# Patient Record
Sex: Female | Born: 1995 | Race: White | Hispanic: Yes | Marital: Single | Smoking: Never smoker
Health system: Southern US, Community
[De-identification: ages and names within clinical notes are randomized; demographics above are authoritative.]

---

## 2017-11-02 ENCOUNTER — Ambulatory Visit (INDEPENDENT_AMBULATORY_CARE_PROVIDER_SITE_OTHER): Payer: PRIVATE HEALTH INSURANCE

## 2017-11-02 ENCOUNTER — Other Ambulatory Visit: Payer: Self-pay

## 2017-11-02 ENCOUNTER — Ambulatory Visit (HOSPITAL_COMMUNITY)
Admission: EM | Admit: 2017-11-02 | Discharge: 2017-11-02 | Disposition: A | Discharge status: Home / Self Care | Payer: PRIVATE HEALTH INSURANCE | Attending: Family Medicine | Admitting: Family Medicine

## 2017-11-02 DIAGNOSIS — M7989 Other specified soft tissue disorders: Secondary | ICD-10-CM

## 2017-11-02 NOTE — ED Triage Notes (Signed)
Pt states she has left foot pain x 2 week injury.

## 2017-11-02 NOTE — ED Provider Notes (Signed)
[facility]    CSN: [id number] Arrival date & time: 11/02/17  1023     History   Chief Complaint Chief Complaint  Patient presents with  . Foot Pain    HPI Gabrielle Pacheco is a 22 y.o. female.   HPI   Patient presents today with a complaint of left foot pain.  She sustained an injury x2 weeks ago and is now experiencing some left foot swelling.Prior treatment includes nothing. No gait or ambulation problems.  OB History   None      Home Medications    Prior to Admission medications   Not on File    Family History No family history on file.  Social History Social History   Tobacco Use  . Smoking status: Not on file  Substance Use Topics  . Alcohol use: Not on file  . Drug use: Not on file     Allergies   Patient has no allergy information on record.   Review of Systems Review of Systems Pertinent negatives listed in HPI Physical Exam Triage Vital Signs ED Triage Vitals [11/02/17 1124]  Enc Vitals Group     BP 106/68     Pulse Rate 88     Resp (!) 65     Temp 98.1 F (36.7 C)     Temp Source Oral     SpO2 99 %     Weight      Height      Head Circumference      Peak Flow      Pain Score      Pain Loc      Pain Edu?      Excl. in GC?    No data found.  Updated Vital Signs BP 106/68 (BP Location: Left Arm)   Pulse 88   Temp 98.1 F (36.7 C) (Oral)   Resp (!) 65   SpO2 99%   Visual Acuity Right Eye Distance:   Left Eye Distance:   Bilateral Distance:    Right Eye Near:   Left Eye Near:    Bilateral Near:     Physical Exam General appearance: alert, well developed, well nourished, cooperative and in no distress Head: Normocephalic, without obvious abnormality, atraumatic Respiratory: Respirations even and unlabored, normal respiratory rate Extremities: No gross deformities Skin: Skin color, texture, turgor normal. No rashes seen  Psych: Appropriate mood and affect. Neurologic: Mental status: Alert, oriented to  person, place, and time, thought content appropriate. UC Treatments / Results  Labs (all labs ordered are listed, but only abnormal results are displayed) Labs Reviewed - No data to display  EKG None  Radiology No results found.  Procedures Procedures (including critical care time)  Medications Ordered in UC Medications - No data to display  Initial Impression / Assessment and Plan / UC Course  I have reviewed the triage vital signs and the nursing notes.  Pertinent labs & imaging results that were available during my care of the patient were reviewed by me and considered in my medical decision making (see chart for details).   Patient presents today with ankle swelling in the absence of an known injury. Imaging was negative for any acute fracture or abnormalities. Patient advised to continue to alternate ice and heat as well as elevate right foot to reduce swelling. Also recommend ibuprofen as needed if pain occurs.  Patient is to follow-up with primary care provider if swelling does not resolve.  Patient agrees and follows up with plan. Final  Clinical Impressions(s) / UC Diagnoses   Final diagnoses:  Swelling of left foot   Discharge Instructions   None    ED Prescriptions    None     Controlled Substance Prescriptions [place] Controlled Substance Registry consulted? Not Applicable   Bing Neighbors, FNP 11/03/17 2144

## 2019-07-28 ENCOUNTER — Ambulatory Visit (HOSPITAL_COMMUNITY)
Admission: RE | Admit: 2019-07-28 | Discharge: 2019-07-28 | Disposition: A | Discharge status: Home / Self Care | Payer: BC Managed Care – PPO | Source: Ambulatory Visit | Attending: Family Medicine | Admitting: Family Medicine

## 2019-07-28 ENCOUNTER — Other Ambulatory Visit: Payer: Self-pay

## 2019-07-28 ENCOUNTER — Encounter (HOSPITAL_COMMUNITY): Payer: Self-pay

## 2019-07-28 VITALS — BP 131/72 | HR 107 | Temp 98.0°F | Resp 17

## 2019-07-28 DIAGNOSIS — R0981 Nasal congestion: Secondary | ICD-10-CM | POA: Insufficient documentation

## 2019-07-28 DIAGNOSIS — J029 Acute pharyngitis, unspecified: Secondary | ICD-10-CM | POA: Insufficient documentation

## 2019-07-28 DIAGNOSIS — R5383 Other fatigue: Secondary | ICD-10-CM | POA: Diagnosis not present

## 2019-07-28 DIAGNOSIS — Z20822 Contact with and (suspected) exposure to covid-19: Secondary | ICD-10-CM | POA: Insufficient documentation

## 2019-07-28 DIAGNOSIS — R52 Pain, unspecified: Secondary | ICD-10-CM | POA: Diagnosis not present

## 2019-07-28 LAB — POCT RAPID STREP A: Streptococcus, Group A Screen (Direct): NEGATIVE

## 2019-07-28 NOTE — Discharge Instructions (Signed)
You may use over the counter ibuprofen or acetaminophen as needed.  For a sore throat, over the counter products such as [employer] Peroxyl Mouth Sore Rinse or Chloraseptic Sore Throat Spray may provide some temporary relief. Your rapid strep test was negative today. We have sent your throat swab for culture and will let you know of any positive results.  You have been tested for COVID-19 today. If your test returns positive, you will receive a phone call from [facility] regarding your results. Negative test results are not called. Both positive and negative results area always visible on MyChart. If you do not have a MyChart account, sign up instructions are provided in your discharge papers. Please do not hesitate to contact us should you have questions or concerns.  

## 2019-07-28 NOTE — ED Triage Notes (Signed)
Patient reports symptoms began yesterday afternoon and "came on pretty suddenly."

## 2019-07-29 LAB — SARS CORONAVIRUS 2 (TAT 6-24 HRS): SARS Coronavirus 2: NEGATIVE

## 2019-07-29 NOTE — ED Provider Notes (Signed)
[facility]   [id number] 07/28/19 Arrival Time: 1535  ASSESSMENT & PLAN:  1. Sore throat   2. Fatigue, unspecified type   3. Body aches     No signs of peritonsillar abscess. Discussed.  OTC symptom care.  Results for orders placed or performed during the hospital encounter of 07/28/19  SARS CORONAVIRUS 2 (TAT 6-24 HRS) Nasopharyngeal Nasopharyngeal Swab   Specimen: Nasopharyngeal Swab  Result Value Ref Range   SARS Coronavirus 2 NEGATIVE NEGATIVE  Culture, group A strep (throat)   Specimen: Throat  Result Value Ref Range   Specimen Description THROAT    Special Requests NONE    Culture      TOO YOUNG TO READ Performed at [facility], [address] [place], [place] [address]    Report Status PENDING   POCT rapid strep A [facility])  Result Value Ref Range   Streptococcus, Group A Screen (Direct) NEGATIVE NEGATIVE   Labs Reviewed  SARS CORONAVIRUS 2 (TAT 6-24 HRS)  CULTURE, GROUP A STREP [facility])  POCT RAPID STREP A       Discharge Instructions      You may use over the counter ibuprofen or acetaminophen as needed.   For a sore throat, over the counter products such as [employer] Peroxyl Mouth Sore Rinse or Chloraseptic Sore Throat Spray may provide some temporary relief.  Your rapid strep test was negative today. We have sent your throat swab for culture and will let you know of any positive results.  You have been tested for COVID-19 today. If your test returns positive, you will receive a phone call from [facility] regarding your results. Negative test results are not called. Both positive and negative results area always visible on MyChart. If you do not have a MyChart account, sign up instructions are provided in your discharge papers. Please do not hesitate to contact us should you have questions or concerns.     Reviewed expectations re: course of current medical issues. Questions answered. Outlined signs and symptoms  indicating need for more acute intervention. Patient verbalized understanding. After Visit Summary given.   SUBJECTIVE:  Gabrielle Pacheco is a 24 y.o. female who reports a sore throat and nasal congestion. Abrupt onset; yesterday. Symptoms have progressed to a point and plateaued since beginning; without voice changes. No respiratory symptoms. Normal PO intake but reports discomfort with swallowing. No specific alleviating factors. Fever: absent. No neck pain or swelling. No associated nausea, vomiting, or abdominal pain. Known sick contacts: none. Recent travel: none. OTC treatment: none reported.   OBJECTIVE:  Vitals:   07/28/19 1607  BP: 131/72  Pulse: (!) 107  Resp: 17  Temp: 98 F (36.7 C)  SpO2: 99%     General appearance: alert; no distress HEENT: throat with mild erythema, moderate erythema and cobblestoning; uvula is midline Neck: supple with FROM; no lymphadenopathy Lungs: speaks full sentences without difficulty; unlabored; mild cough Abd: soft; non-tender Skin: reveals no rash; warm and dry Psychological: alert and cooperative; normal mood and affect  No Known Allergies  History reviewed. No pertinent past medical history. Social History   Socioeconomic History  . Marital status: Single    Spouse name: Not on file  . Number of children: Not on file  . Years of education: Not on file  . Highest education level: Not on file  Occupational History  . Not on file  Tobacco Use  . Smoking status: Never Smoker  . Smokeless tobacco: Never Used  Vaping Use  . Vaping Use: Never used  Substance and Sexual Activity  . Alcohol use: Yes    Alcohol/week: 2.0 - 3.0 standard drinks    Types: 2 - 3 Standard drinks or equivalent per week  . Drug use: Never  . Sexual activity: Not on file  Other Topics Concern  . Not on file  Social History Narrative  . Not on file   Social Determinants of Health   Financial Resource Strain:   . Difficulty of Paying Living Expenses:    Food Insecurity:   . Worried About Charity fundraiser in the Last Year:   . Arboriculturist in the Last Year:   Transportation Needs:   . Film/video editor (Medical):   Marland Kitchen Lack of Transportation (Non-Medical):   Physical Activity:   . Days of Exercise per Week:   . Minutes of Exercise per Session:   Stress:   . Feeling of Stress :   Social Connections:   . Frequency of Communication with Friends and Family:   . Frequency of Social Gatherings with Friends and Family:   . Attends Religious Services:   . Active Member of Clubs or Organizations:   . Attends Archivist Meetings:   Marland Kitchen Marital Status:   Intimate Partner Violence:   . Fear of Current or Ex-Partner:   . Emotionally Abused:   Marland Kitchen Physically Abused:   . Sexually Abused:    No family history on file.        Vanessa Kick, MD 07/29/19 [phone]

## 2019-07-31 LAB — CULTURE, GROUP A STREP (THRC)

## 2019-08-28 IMAGING — DX DG FOOT COMPLETE 3+V*L*
3 series · 3 of 3 positions shown · non-contrast
Comparison: None.

CLINICAL DATA: Pain without trauma, beginning 2 weeks ago.
Swelling.

EXAM:
LEFT FOOT - COMPLETE 3+ VIEW

[foot ap]
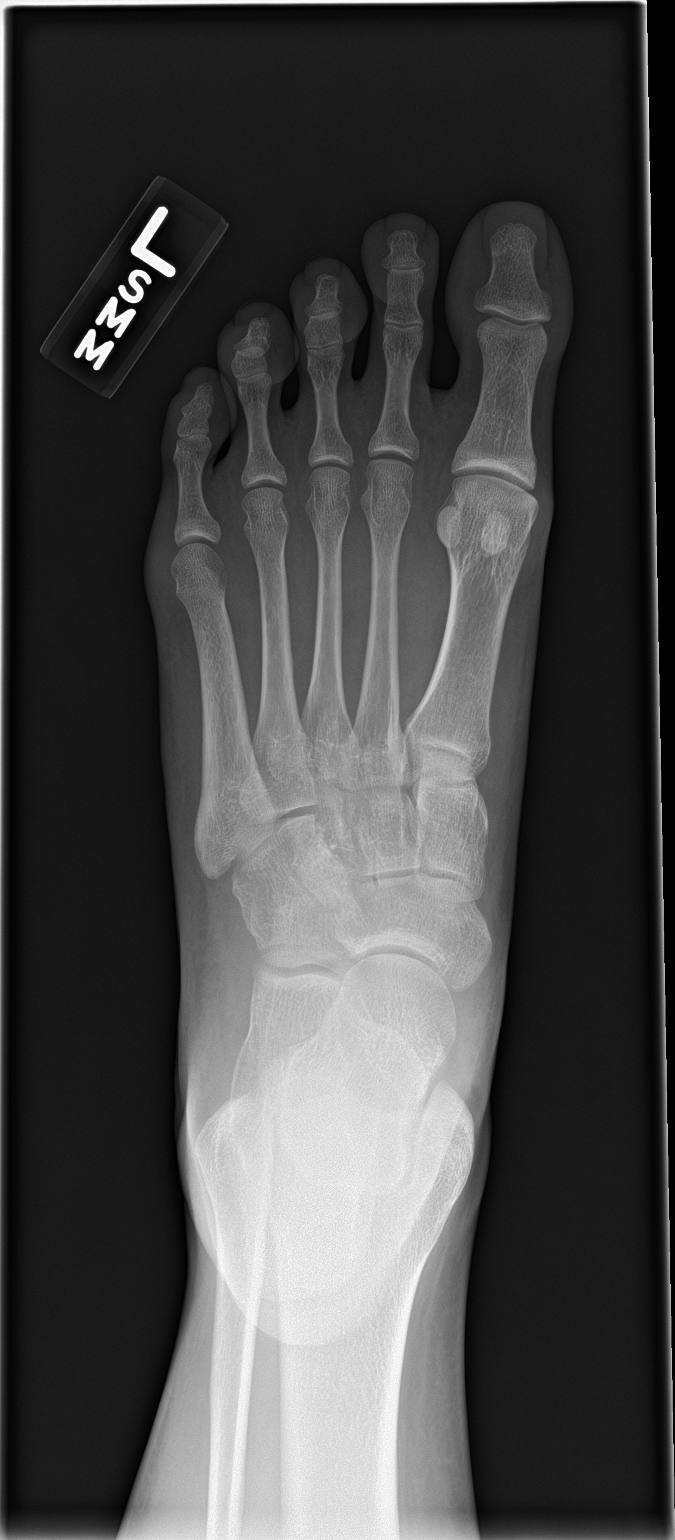

[foot obl]
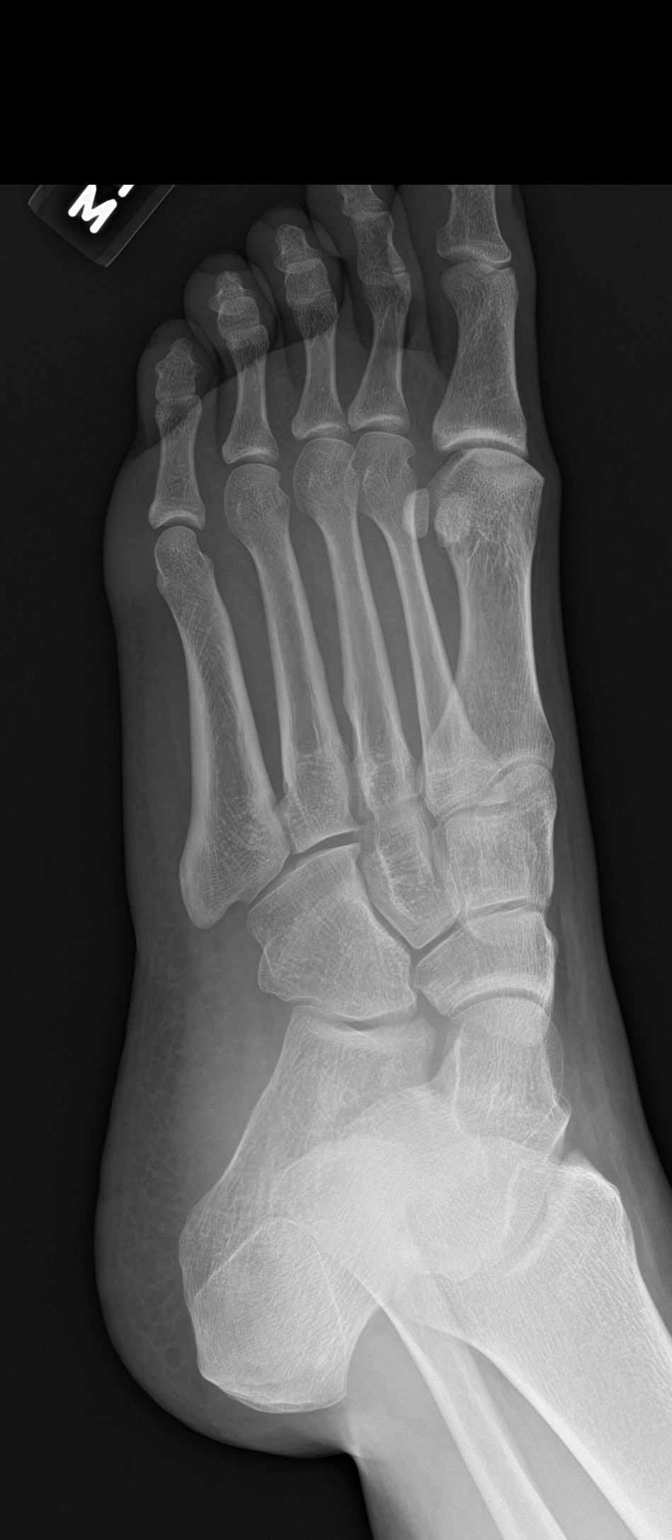

[foot lat]
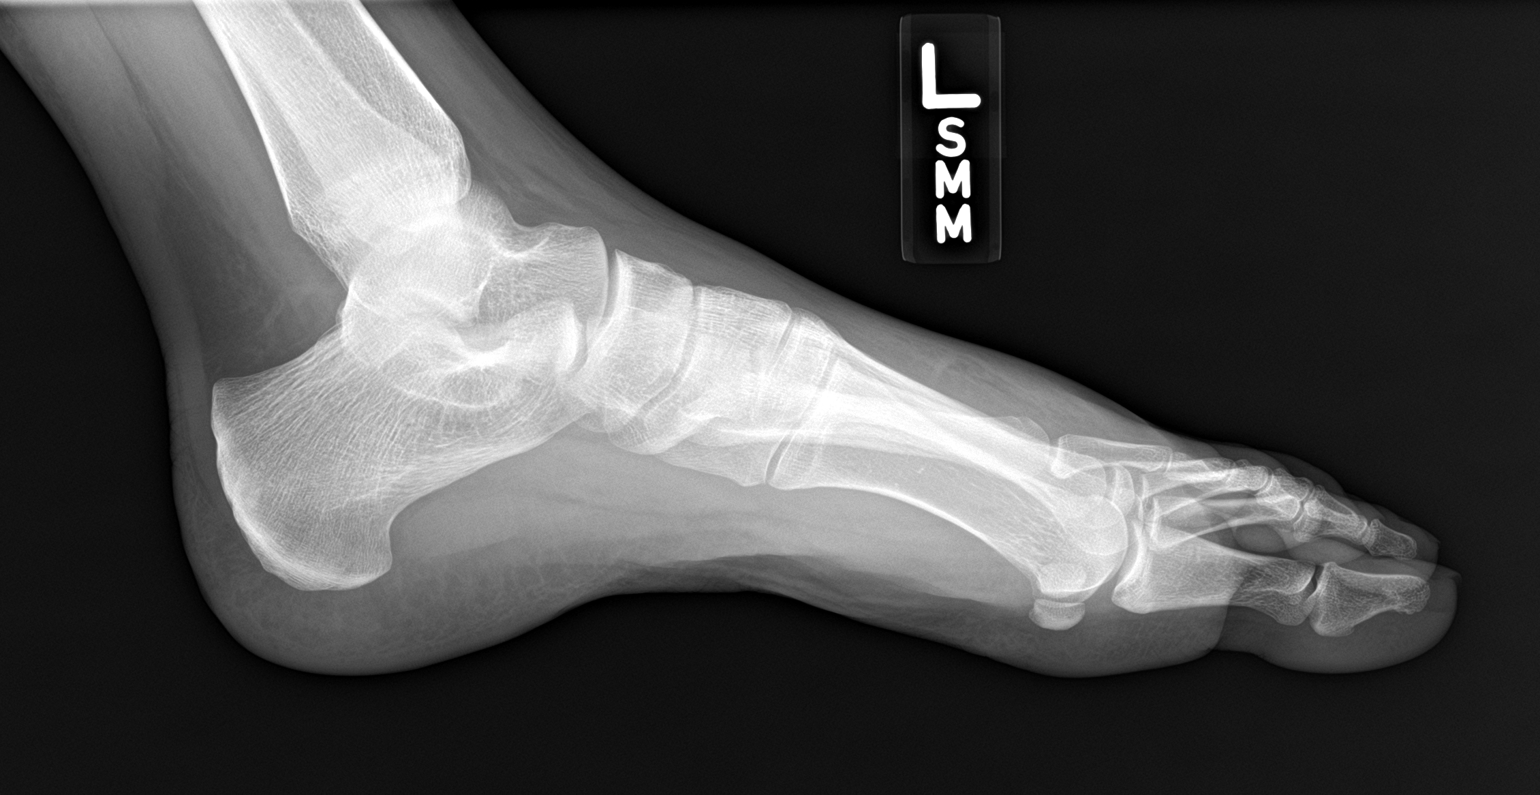

[3 of 3 positions shown; findings below may reference images not displayed]

FINDINGS: Mild soft tissue swelling along the top of the foot. No underlying
cause identified. Bones are intact with no lesions, degenerative
changes, or fractures noted.
IMPRESSION: Soft tissue swelling over the top of the foot. No underlying bony
abnormality noted.

## 2022-06-23 ENCOUNTER — Emergency Department (HOSPITAL_COMMUNITY)
Admission: EM | Admit: 2022-06-23 | Discharge: 2022-06-23 | Disposition: A | Discharge status: Home / Self Care | Payer: Commercial Managed Care - PPO | Attending: Emergency Medicine | Admitting: Emergency Medicine

## 2022-06-23 ENCOUNTER — Emergency Department (HOSPITAL_COMMUNITY): Payer: Commercial Managed Care - PPO

## 2022-06-23 ENCOUNTER — Encounter (HOSPITAL_COMMUNITY): Payer: Self-pay

## 2022-06-23 ENCOUNTER — Other Ambulatory Visit: Payer: Self-pay

## 2022-06-23 DIAGNOSIS — R4701 Aphasia: Secondary | ICD-10-CM | POA: Diagnosis present

## 2022-06-23 DIAGNOSIS — G43809 Other migraine, not intractable, without status migrainosus: Secondary | ICD-10-CM | POA: Insufficient documentation

## 2022-06-23 LAB — BASIC METABOLIC PANEL
Anion gap: 11 (ref 5–15)
BUN: 12 mg/dL (ref 6–20)
CO2: 20 mmol/L — ABNORMAL LOW (ref 22–32)
Calcium: 8.7 mg/dL — ABNORMAL LOW (ref 8.9–10.3)
Chloride: 106 mmol/L (ref 98–111)
Creatinine, Ser: 0.72 mg/dL (ref 0.44–1.00)
GFR, Estimated: >60 mL/min (ref >60)
Glucose, Bld: 84 mg/dL (ref 70–99)
Potassium: 3.6 mmol/L (ref 3.5–5.1)
Sodium: 137 mmol/L (ref 135–145)

## 2022-06-23 LAB — CBC
HCT: 40.2 % (ref 36.0–46.0)
Hemoglobin: 13.3 g/dL (ref 12.0–15.0)
MCH: 29.8 pg (ref 26.0–34.0)
MCHC: 33.1 g/dL (ref 30.0–36.0)
MCV: 89.9 fL (ref 80.0–100.0)
Platelets: 347 10*3/uL (ref 150–400)
RBC: 4.47 MIL/uL (ref 3.87–5.11)
RDW: 11.6 % (ref 11.5–15.5)
WBC: 11.2 10*3/uL — ABNORMAL HIGH (ref 4.0–10.5)
nRBC: 0 % (ref 0.0–0.2)

## 2022-06-23 MED ORDER — KETOROLAC TROMETHAMINE 15 MG/ML IJ SOLN
15.0000 mg | Freq: Once | INTRAMUSCULAR | Status: AC
  Administered 2022-06-23: 15 mg via INTRAVENOUS
  Filled 2022-06-23: qty 1

## 2022-06-23 MED ORDER — METOCLOPRAMIDE HCL 5 MG/ML IJ SOLN
10.0000 mg | Freq: Once | INTRAMUSCULAR | Status: AC
  Administered 2022-06-23: 10 mg via INTRAVENOUS
  Filled 2022-06-23: qty 2

## 2022-06-23 NOTE — Discharge Instructions (Signed)
Return to the ED with any new or worsening signs or symptoms Please follow-up with your PCP for reevaluation and further management

## 2022-06-23 NOTE — ED Provider Notes (Signed)
EMERGENCY DEPARTMENT AT [facility] Provider Note   CSN: [id number] Arrival date & time: 06/23/22  1601     History  Chief Complaint  Patient presents with   Neurologic Problem    Gabrielle Pacheco is a 27 y.o. female with no documented medical history.  The patient presents to the ED for evaluation of visual deficit, aphasia.  The patient reports that she woke up this morning in her usual state of health.  Her and her fianc went to brunch with her friends.  She states that they then arrived home and she began putting away gas that she received at a bridal shower.  The patient states that she then became dizzy and she laid down to take a nap.  Patient reports that 10 minutes past and she woke up and she was very "disoriented" and she also had a headache to the left side of her head.  Patient states that she was unable to remember her fianc's name but was able to find his number saved in her contact information and called him stating that she did not feel right.  The patient states that she was having issues finding her words and that the fingertips of her bilateral hands were tingling.  She states that at this time she was also breathing very fast.  Patient is on to state that she was also seeing spots in her peripheral vision.  The patient on my current examination and interview states she feels much better and is back to her baseline.  She denies any one-sided weakness or numbness, nausea, vomiting, chest pain, shortness of breath.  She denies any syncope.  She denies history of the same presentation.   Neurologic Problem Associated symptoms include headaches.       Home Medications Prior to Admission medications   Not on File      Allergies    Patient has no known allergies.    Review of Systems   Review of Systems  Neurological:  Positive for dizziness and headaches. Negative for weakness and numbness.  All other systems reviewed and are  negative.   Physical Exam Updated Vital Signs BP (!) 137/99 (BP Location: Right Arm)   Pulse 96   Temp 98.5 F (36.9 C)   Resp 18   Ht 5\' 10"  (1.778 m)   Wt 88.5 kg   SpO2 99%   BMI 27.98 kg/m  Physical Exam Vitals and nursing note reviewed.  Constitutional:      General: She is not in acute distress.    Appearance: Normal appearance. She is not ill-appearing, toxic-appearing or diaphoretic.  HENT:     Head: Normocephalic and atraumatic.     Nose: Nose normal.     Mouth/Throat:     Mouth: Mucous membranes are moist.     Pharynx: Oropharynx is clear.  Eyes:     Extraocular Movements: Extraocular movements intact.     Conjunctiva/sclera: Conjunctivae normal.     Pupils: Pupils are equal, round, and reactive to light.  Cardiovascular:     Rate and Rhythm: Normal rate and regular rhythm.  Pulmonary:     Effort: Pulmonary effort is normal.     Breath sounds: Normal breath sounds. No wheezing.  Abdominal:     General: Abdomen is flat. Bowel sounds are normal.     Palpations: Abdomen is soft.  Musculoskeletal:     Cervical back: Normal range of motion and neck supple. No tenderness.  Skin:    General:  Skin is warm and dry.     Capillary Refill: Capillary refill takes less than 2 seconds.  Neurological:     General: No focal deficit present.     Mental Status: She is alert and oriented to person, place, and time.     GCS: GCS eye subscore is 4. GCS verbal subscore is 5. GCS motor subscore is 6.     Cranial Nerves: Cranial nerves 2-12 are intact. No cranial nerve deficit.     Sensory: Sensation is intact. No sensory deficit.     Motor: Motor function is intact. No weakness.     Coordination: Coordination is intact. Heel to [facility] Test normal.     Comments: No focal neurodeficits noted on examination.  Intact finger-nose, heel-to-shin.  Cranial nerves II through XII intact.  No pronator drift.  No slurred speech or facial droop.  5 out of 5 strength bilateral upper and lower  extremities.  Patient follows commands appropriately.  Patient alert and oriented x 3.  Patient ambulates steady gait.     ED Results / Procedures / Treatments   Labs (all labs ordered are listed, but only abnormal results are displayed) Labs Reviewed  CBC - Abnormal; Notable for the following components:      Result Value   WBC 11.2 (*)    All other components within normal limits  BASIC METABOLIC PANEL - Abnormal; Notable for the following components:   CO2 20 (*)    Calcium 8.7 (*)    All other components within normal limits    EKG EKG Interpretation  Date/Time:  Sunday Jun 23 2022 16:09:30 EDT Ventricular Rate:  90 PR Interval:  128 QRS Duration: 78 QT Interval:  362 QTC Calculation: 442 R Axis:   57 Text Interpretation: Normal sinus rhythm with sinus arrhythmia Normal ECG No previous ECGs available Confirmed by Kristine Royal [phone]) on 06/23/2022 7:11:05 PM  Radiology CT Head Wo Contrast  Result Date: 06/23/2022 CLINICAL DATA:  Altered level of consciousness, peripheral vision loss, expressive aphasia, last known well 1,500 EXAM: CT HEAD WITHOUT CONTRAST TECHNIQUE: Contiguous axial images were obtained from the base of the skull through the vertex without intravenous contrast. RADIATION DOSE REDUCTION: This exam was performed according to the departmental dose-optimization program which includes automated exposure control, adjustment of the mA and/or kV according to patient size and/or use of iterative reconstruction technique. COMPARISON:  None Available. FINDINGS: Brain: No acute infarct or hemorrhage. Lateral ventricles and midline structures are unremarkable. No acute extra-axial fluid collections. No mass effect. Vascular: No hyperdense vessel or unexpected calcification. Skull: Normal. Negative for fracture or focal lesion. Sinuses/Orbits: No acute finding. Other: None. IMPRESSION: 1. No acute intracranial process. Electronically Signed   By: Sharlet Salina M.D.   On:  06/23/2022 17:50    Procedures Procedures   Medications Ordered in ED Medications  ketorolac (TORADOL) 15 MG/ML injection 15 mg (15 mg Intravenous Given 06/23/22 1756)  metoCLOPramide (REGLAN) injection 10 mg (10 mg Intravenous Given 06/23/22 1758)    ED Course/ Medical Decision Making/ A&P  Medical Decision Making Amount and/or Complexity of Data Reviewed Labs: ordered. Radiology: ordered.  Risk Prescription drug management.   27 year old female presents to the ED for evaluation.  Please see HPI for further details.  On examination the patient is afebrile and nontachycardic.  Her lung sounds are clear bilaterally and she is not hypoxic on room air.  Her abdomen is soft and compressible throughout.  Her neurological examination shows no focal neurodeficits.  Patient case discussed with attending Dr. Rodena Medin who also evaluated the patient.  At this time we will proceed with CBC, BMP, EKG, CT head without contrast.  Will provide the patient Toradol and Reglan for suspected migraine.  After Toradol and Reglan were administered patient reports she feels much better at this time.  She has no symptoms.  Patient CT head without contrast unremarkable.  Patient CBC with leukocytosis 11.2, no anemia.  Patient BMP with no electrolyte derangement, no elevated creatinine.  EKG nonischemic.  On reassessment patient reports she is feeling better.  She denies any feelings of headache, disorientation.  The patient is able to ambulate with steady gait.  The patient will be discharged home at this time and she will follow-up with her PCP.  The patient will return to the ED with any new or worsening signs or symptoms.  The patient and her fianc at the bedside were given return precautions and they voiced understanding.  They had all their questions answered to their satisfaction.  Stable to discharge home at this time.   Final Clinical Impression(s) / ED Diagnoses Final diagnoses:  Other migraine  without status migrainosus, not intractable    Rx / DC Orders ED Discharge Orders     None         Clent Ridges 06/23/22 Angela Adam, MD 06/23/22 2027

## 2022-06-23 NOTE — ED Triage Notes (Signed)
Pt states she was having trouble getting words out, can't see out of peripheral bilat, expressive aphasia, SOB started at 1500 today. Pt's LKW 1500 today.
